# Patient Record
Sex: Female | Born: 1985 | Race: White | Hispanic: No | Marital: Single
Health system: Southern US, Community
[De-identification: ages and names within clinical notes are randomized; demographics above are authoritative.]

---
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
## 2006-03-07 ENCOUNTER — Emergency Department (HOSPITAL_COMMUNITY): Admission: EM | Admit: 2006-03-07 | Discharge: 2006-03-08 | Payer: Self-pay | Admitting: Emergency Medicine

## 2006-08-21 ENCOUNTER — Emergency Department (HOSPITAL_COMMUNITY): Admission: EM | Admit: 2006-08-21 | Discharge: 2006-08-21 | Payer: Self-pay | Admitting: Emergency Medicine

## 2006-09-01 ENCOUNTER — Emergency Department (HOSPITAL_COMMUNITY): Admission: EM | Admit: 2006-09-01 | Discharge: 2006-09-01 | Payer: Self-pay | Admitting: Emergency Medicine
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
## 2018-05-27 ENCOUNTER — Telehealth: Payer: Self-pay | Admitting: Family

## 2018-05-27 DIAGNOSIS — R5381 Other malaise: Secondary | ICD-10-CM

## 2018-05-27 NOTE — Progress Notes (Signed)
Greater than 5 minutes, yet less than 10 minutes of time have been spent researching, coordinating, and implementing care for this patient today.  Thank you for the details you included in the comment boxes. Those details are very helpful in determining the best course of treatment for you and help [place] to provide the best care.  Your fever and headache and body aches could be related to numerous conditions, very unlikely the coronavirus as you are missing the major symptoms.   E-Visit for Corona Virus Screening Based on your current symptoms, it seems unlikely that your symptoms are related to the [place] virus.   Coronavirus disease 2019 (COVID-19) is a respiratory illness that can spread from person to person. The virus that causes COVID-19 is a new virus that was first identified in the country of [place] but is now found in multiple other countries and has spread to the [place].  Symptoms associated with the virus are mild to severe fever, cough, and shortness of breath. There is currently no vaccine to protect against COVID-19, and there is no specific antiviral treatment for the virus.  It is vitally important that if you feel that you have an infection such as this virus or any other virus that you stay home and away from places where you may spread it to others.  Currently, not all patients are being tested. If the symptoms are mild and there is not a known exposure, performing the test is not indicated.   Reduce your risk of any infection by using the same precautions used for avoiding the common cold or flu:  [place] [employer] Wash your hands often with soap and warm water for at least 20 seconds.  If soap and water are not readily available, use an alcohol-based hand sanitizer with at least 60% alcohol.  . If coughing or sneezing, cover your mouth and nose by coughing or sneezing into the elbow areas of your shirt or coat, into a tissue or into your sleeve (not your hands). . Avoid shaking hands with  others and consider head nods or verbal greetings only. . Avoid touching your eyes, nose, or mouth with unwashed hands.  . Avoid close contact with people who are sick. . Avoid places or events with large numbers of people in one location, like concerts or sporting events. . Carefully consider travel plans you have or are making. . If you are planning any travel outside or inside the [place], visit the CDC's Travelers' Health webpage for the latest health notices. . If you have some symptoms but not all symptoms, continue to monitor at home and seek medical attention if your symptoms worsen. . If you are having a medical emergency, call 911.  HOME CARE . Only take medications as instructed by your medical team. . Drink plenty of fluids and get plenty of rest. . A steam or ultrasonic humidifier can help if you have congestion.   GET HELP RIGHT AWAY IF: . You develop worsening fever. . You become short of breath . You cough up blood. . Your symptoms become more severe MAKE SURE YOU   Understand these instructions.  Will watch your condition.  Will get help right away if you are not doing well or get worse.  Your e-visit answers were reviewed by a board certified advanced clinical practitioner to complete your personal care plan.  Depending on the condition, your plan could have included both over the counter or prescription medications.  If there is a problem please reply  once you have received a response from your provider. Your safety is important to [place].  If you have drug allergies check your prescription carefully.    You can use MyChart to ask questions about today's visit, request a non-urgent call back, or ask for a work or school excuse for 24 hours related to this e-Visit. If it has been greater than 24 hours you will need to follow up with your provider, or enter a new e-Visit to address those concerns. You will get an e-mail in the next two days asking about your experience.  I hope  that your e-visit has been valuable and will speed your recovery. Thank you for using e-visits.
